# Patient Record
Sex: Female | Born: 1990 | Race: Black or African American | Hispanic: No | Marital: Single
Health system: Southern US, Community
[De-identification: ages and names within clinical notes are randomized; demographics above are authoritative.]

---

## 2014-12-15 ENCOUNTER — Emergency Department (INDEPENDENT_AMBULATORY_CARE_PROVIDER_SITE_OTHER)
Admission: EM | Admit: 2014-12-15 | Discharge: 2014-12-15 | Disposition: A | Discharge status: Home / Self Care | Payer: Self-pay | Source: Home / Self Care | Attending: Family Medicine | Admitting: Family Medicine

## 2014-12-15 ENCOUNTER — Encounter (HOSPITAL_COMMUNITY): Payer: Self-pay | Admitting: Emergency Medicine

## 2014-12-15 DIAGNOSIS — M7918 Myalgia, other site: Secondary | ICD-10-CM

## 2014-12-15 DIAGNOSIS — M791 Myalgia: Secondary | ICD-10-CM

## 2014-12-15 MED ORDER — CYCLOBENZAPRINE HCL 5 MG PO TABS: 5.0000 mg | ORAL_TABLET | Freq: Three times a day (TID) | ORAL | Status: AC | PRN

## 2014-12-15 MED ORDER — IBUPROFEN 800 MG PO TABS
800.0000 mg | ORAL_TABLET | Freq: Three times a day (TID) | ORAL | Status: AC | PRN
Start: 2014-12-15 — End: ?
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
## 2014-12-15 NOTE — Discharge Instructions (Signed)
Try the ibuprofen and cyclobenzaprine for your pain, You may also find that heat will help a lot.   Muscle Cramps and Spasms Muscle cramps and spasms are when muscles tighten by themselves. They usually get better within minutes. Muscle cramps are painful. They are usually stronger and last longer than muscle spasms. Muscle spasms may or may not be painful. They can last a few seconds or much longer. HOME CARE  Drink enough fluid to keep your pee (urine) clear or pale yellow.  Massage, stretch, and relax the muscle.  Use a warm towel, heating pad, or warm shower water on tight muscles.  Place ice on the muscle if it is tender or in pain.  Put ice in a plastic bag.  Place a towel between your skin and the bag.  Leave the ice on for 15-20 minutes, 03-04 times a day.  Only take medicine as told by your doctor. GET HELP RIGHT AWAY IF:  Your cramps or spasms get worse, happen more often, or do not get better with time. MAKE SURE YOU:  Understand these instructions.  Will watch your condition.  Will get help right away if you are not doing well or get worse. Document Released: 10/04/2008 Document Revised: 02/16/2013 Document Reviewed: 10/08/2012 [facility] CenterExitCare Patient Information 2015 [place], [place]. This information is not intended to replace advice given to you by your health care provider. Make sure you discuss any questions you have with your health care provider.

## 2014-12-16 NOTE — ED Provider Notes (Signed)
I saw this patient with Dr. Ermalinda MemosBradshaw.  I have also examined the patient.  I agree with his findings assessment and plan.  Rodolph BongEvan S Shary Lamos, MD 12/16/14 0800

## 2015-02-08 ENCOUNTER — Emergency Department (INDEPENDENT_AMBULATORY_CARE_PROVIDER_SITE_OTHER)
Admission: EM | Admit: 2015-02-08 | Discharge: 2015-02-08 | Disposition: A | Discharge status: Home / Self Care | Payer: Self-pay | Source: Home / Self Care | Attending: Family Medicine | Admitting: Family Medicine

## 2015-02-08 ENCOUNTER — Encounter (HOSPITAL_COMMUNITY): Payer: Self-pay | Admitting: Emergency Medicine

## 2015-02-08 ENCOUNTER — Emergency Department (INDEPENDENT_AMBULATORY_CARE_PROVIDER_SITE_OTHER): Payer: Self-pay

## 2015-02-08 DIAGNOSIS — S46812A Strain of other muscles, fascia and tendons at shoulder and upper arm level, left arm, initial encounter: Secondary | ICD-10-CM

## 2015-02-08 DIAGNOSIS — T148 Other injury of unspecified body region: Secondary | ICD-10-CM

## 2015-02-08 DIAGNOSIS — T148XXA Other injury of unspecified body region, initial encounter: Secondary | ICD-10-CM

## 2015-02-08 NOTE — Discharge Instructions (Signed)
Thank you for coming in today. Take up to 800 mg of ibuprofen every 8 hours as needed for pain. Follow-up with Dr. Katrinka BlazingSmith as needed. Attend physical therapy. Come back or go to the emergency room if you notice new weakness new numbness problems walking or bowel or bladder problems.  Cervical Sprain A cervical sprain is when the tissues (ligaments) that hold the neck bones in place stretch or tear. HOME CARE   Put ice on the injured area.  Put ice in a plastic bag.  Place a towel between your skin and the bag.  Leave the ice on for 15-20 minutes, 3-4 times a day.  You may have been given a collar to wear. This collar keeps your neck from moving while you heal.  Do not take the collar off unless told by your doctor.  If you have long hair, keep it outside of the collar.  Ask your doctor before changing the position of your collar. You may need to change its position over time to make it more comfortable.  If you are allowed to take off the collar for cleaning or bathing, follow your doctor's instructions on how to do it safely.  Keep your collar clean by wiping it with mild soap and water. Dry it completely. If the collar has removable pads, remove them every 1-2 days to hand wash them with soap and water. Allow them to air dry. They should be dry before you wear them in the collar.  Do not drive while wearing the collar.  Only take medicine as told by your doctor.  Keep all doctor visits as told.  Keep all physical therapy visits as told.  Adjust your work station so that you have good posture while you work.  Avoid positions and activities that make your problems worse.  Warm up and stretch before being active. GET HELP IF:  Your pain is not controlled with medicine.  You cannot take less pain medicine over time as planned.  Your activity level does not improve as expected. GET HELP RIGHT AWAY IF:   You are bleeding.  Your stomach is upset.  You have an allergic  reaction to your medicine.  You develop new problems that you cannot explain.  You lose feeling (become numb) or you cannot move any part of your body (paralysis).  You have tingling or weakness in any part of your body.  Your symptoms get worse. Symptoms include:  Pain, soreness, stiffness, puffiness (swelling), or a burning feeling in your neck.  Pain when your neck is touched.  Shoulder or upper back pain.  Limited ability to move your neck.  Headache.  Dizziness.  Your hands or arms feel week, lose feeling, or tingle.  Muscle spasms.  Difficulty swallowing or chewing. MAKE SURE YOU:   Understand these instructions.  Will watch your condition.  Will get help right away if you are not doing well or get worse. Document Released: 04/09/2008 Document Revised: 06/24/2013 Document Reviewed: 04/29/2013 [facility]Exit[facility] Patient Information 2015 [place], [place]. This information is not intended to replace advice given to you by your health care provider. Make sure you discuss any questions you have with your health care provider.
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
